# Patient Record
Sex: Female | Born: 1940 | Race: White | Hispanic: No | Smoking: Former smoker
Health system: Southern US, Community
[De-identification: ages and names within clinical notes are randomized; demographics above are authoritative.]

## PROBLEM LIST (undated history)

## (undated) DIAGNOSIS — R490 Dysphonia: Secondary | ICD-10-CM

## (undated) DIAGNOSIS — E041 Nontoxic single thyroid nodule: Secondary | ICD-10-CM

## (undated) DIAGNOSIS — R001 Bradycardia, unspecified: Secondary | ICD-10-CM

## (undated) DIAGNOSIS — E785 Hyperlipidemia, unspecified: Secondary | ICD-10-CM

## (undated) DIAGNOSIS — G2581 Restless legs syndrome: Secondary | ICD-10-CM

## (undated) DIAGNOSIS — I10 Essential (primary) hypertension: Secondary | ICD-10-CM

## (undated) DIAGNOSIS — I2510 Atherosclerotic heart disease of native coronary artery without angina pectoris: Secondary | ICD-10-CM

## (undated) DIAGNOSIS — J449 Chronic obstructive pulmonary disease, unspecified: Secondary | ICD-10-CM

## (undated) DIAGNOSIS — R51 Headache: Secondary | ICD-10-CM

## (undated) HISTORY — PX: PORTACATH PLACEMENT: SHX2246

## (undated) HISTORY — PX: HAND SURGERY: SHX662

## (undated) HISTORY — PX: RIGHT OOPHORECTOMY: SHX2359

## (undated) HISTORY — DX: Dysphonia: R49.0

## (undated) HISTORY — DX: Bradycardia, unspecified: R00.1

## (undated) HISTORY — DX: Chronic obstructive pulmonary disease, unspecified: J44.9

## (undated) HISTORY — DX: Headache: R51

## (undated) HISTORY — DX: Restless legs syndrome: G25.81

## (undated) HISTORY — DX: Atherosclerotic heart disease of native coronary artery without angina pectoris: I25.10

## (undated) HISTORY — PX: HIP SURGERY: SHX245

## (undated) HISTORY — PX: CARPAL TUNNEL RELEASE: SHX101

## (undated) HISTORY — DX: Nontoxic single thyroid nodule: E04.1

## (undated) HISTORY — DX: Hyperlipidemia, unspecified: E78.5

## (undated) HISTORY — PX: ROTATOR CUFF REPAIR: SHX139

## (undated) HISTORY — DX: Essential (primary) hypertension: I10

## (undated) HISTORY — PX: ABDOMINAL HYSTERECTOMY: SHX81

---

## 2001-11-08 ENCOUNTER — Encounter: Admission: RE | Admit: 2001-11-08 | Discharge: 2001-11-08 | Payer: Self-pay | Admitting: *Deleted

## 2001-11-08 ENCOUNTER — Encounter: Payer: Self-pay | Admitting: *Deleted

## 2001-11-11 ENCOUNTER — Encounter (INDEPENDENT_AMBULATORY_CARE_PROVIDER_SITE_OTHER): Payer: Self-pay | Admitting: *Deleted

## 2001-11-11 ENCOUNTER — Ambulatory Visit (HOSPITAL_BASED_OUTPATIENT_CLINIC_OR_DEPARTMENT_OTHER): Admission: RE | Admit: 2001-11-11 | Discharge: 2001-11-11 | Payer: Self-pay | Admitting: *Deleted

## 2004-06-10 ENCOUNTER — Inpatient Hospital Stay (HOSPITAL_COMMUNITY): Admission: EM | Admit: 2004-06-10 | Discharge: 2004-06-11 | Payer: Self-pay | Admitting: Emergency Medicine

## 2006-08-13 ENCOUNTER — Encounter: Admission: RE | Admit: 2006-08-13 | Discharge: 2006-08-13 | Payer: Self-pay | Admitting: Orthopedic Surgery

## 2006-08-15 ENCOUNTER — Ambulatory Visit (HOSPITAL_BASED_OUTPATIENT_CLINIC_OR_DEPARTMENT_OTHER): Admission: RE | Admit: 2006-08-15 | Discharge: 2006-08-15 | Payer: Self-pay | Admitting: Orthopedic Surgery

## 2006-12-05 ENCOUNTER — Ambulatory Visit (HOSPITAL_BASED_OUTPATIENT_CLINIC_OR_DEPARTMENT_OTHER): Admission: RE | Admit: 2006-12-05 | Discharge: 2006-12-05 | Payer: Self-pay | Admitting: Orthopedic Surgery

## 2006-12-13 ENCOUNTER — Encounter: Admission: RE | Admit: 2006-12-13 | Discharge: 2006-12-14 | Payer: Self-pay | Admitting: Orthopedic Surgery

## 2006-12-13 ENCOUNTER — Encounter: Admission: RE | Admit: 2006-12-13 | Discharge: 2007-03-11 | Payer: Self-pay | Admitting: Orthopedic Surgery

## 2007-12-16 ENCOUNTER — Encounter: Admission: RE | Admit: 2007-12-16 | Discharge: 2007-12-16 | Payer: Self-pay | Admitting: Orthopedic Surgery

## 2007-12-18 ENCOUNTER — Ambulatory Visit (HOSPITAL_BASED_OUTPATIENT_CLINIC_OR_DEPARTMENT_OTHER): Admission: RE | Admit: 2007-12-18 | Discharge: 2007-12-18 | Payer: Self-pay | Admitting: Orthopedic Surgery

## 2009-11-02 ENCOUNTER — Encounter: Payer: Self-pay | Admitting: Family

## 2009-12-27 ENCOUNTER — Encounter: Payer: Self-pay | Admitting: Family

## 2010-01-11 ENCOUNTER — Emergency Department (HOSPITAL_BASED_OUTPATIENT_CLINIC_OR_DEPARTMENT_OTHER): Admission: EM | Admit: 2010-01-11 | Discharge: 2010-01-11 | Payer: Self-pay | Admitting: Emergency Medicine

## 2010-01-17 ENCOUNTER — Emergency Department (HOSPITAL_BASED_OUTPATIENT_CLINIC_OR_DEPARTMENT_OTHER): Admission: EM | Admit: 2010-01-17 | Discharge: 2010-01-17 | Payer: Self-pay | Admitting: Emergency Medicine

## 2010-01-17 ENCOUNTER — Ambulatory Visit: Payer: Self-pay | Admitting: Radiology

## 2010-02-09 ENCOUNTER — Telehealth: Payer: Self-pay | Admitting: Family

## 2010-02-09 ENCOUNTER — Ambulatory Visit: Payer: Self-pay | Admitting: Family

## 2010-02-09 DIAGNOSIS — J4489 Other specified chronic obstructive pulmonary disease: Secondary | ICD-10-CM | POA: Insufficient documentation

## 2010-02-09 DIAGNOSIS — G894 Chronic pain syndrome: Secondary | ICD-10-CM

## 2010-02-09 DIAGNOSIS — F411 Generalized anxiety disorder: Secondary | ICD-10-CM | POA: Insufficient documentation

## 2010-02-09 DIAGNOSIS — E119 Type 2 diabetes mellitus without complications: Secondary | ICD-10-CM

## 2010-02-09 DIAGNOSIS — E785 Hyperlipidemia, unspecified: Secondary | ICD-10-CM

## 2010-02-09 DIAGNOSIS — F329 Major depressive disorder, single episode, unspecified: Secondary | ICD-10-CM

## 2010-02-09 DIAGNOSIS — J449 Chronic obstructive pulmonary disease, unspecified: Secondary | ICD-10-CM

## 2010-02-09 DIAGNOSIS — I2510 Atherosclerotic heart disease of native coronary artery without angina pectoris: Secondary | ICD-10-CM | POA: Insufficient documentation

## 2010-02-10 ENCOUNTER — Telehealth: Payer: Self-pay | Admitting: Family

## 2010-02-11 ENCOUNTER — Encounter: Payer: Self-pay | Admitting: Family

## 2010-02-11 DIAGNOSIS — Z950 Presence of cardiac pacemaker: Secondary | ICD-10-CM

## 2010-02-14 ENCOUNTER — Encounter: Payer: Self-pay | Admitting: Family

## 2010-02-17 ENCOUNTER — Telehealth: Payer: Self-pay | Admitting: Family

## 2010-02-22 ENCOUNTER — Encounter: Payer: Self-pay | Admitting: Family

## 2010-02-22 DIAGNOSIS — M949 Disorder of cartilage, unspecified: Secondary | ICD-10-CM

## 2010-02-22 DIAGNOSIS — M899 Disorder of bone, unspecified: Secondary | ICD-10-CM | POA: Insufficient documentation

## 2010-02-26 ENCOUNTER — Emergency Department (HOSPITAL_BASED_OUTPATIENT_CLINIC_OR_DEPARTMENT_OTHER): Admission: EM | Admit: 2010-02-26 | Discharge: 2010-02-26 | Payer: Self-pay | Admitting: Emergency Medicine

## 2010-02-28 ENCOUNTER — Telehealth: Payer: Self-pay | Admitting: Family

## 2010-02-28 DIAGNOSIS — M1990 Unspecified osteoarthritis, unspecified site: Secondary | ICD-10-CM | POA: Insufficient documentation

## 2010-03-01 ENCOUNTER — Telehealth: Payer: Self-pay | Admitting: Family

## 2010-03-02 ENCOUNTER — Telehealth: Payer: Self-pay | Admitting: Family

## 2010-03-07 ENCOUNTER — Encounter: Payer: Self-pay | Admitting: Family

## 2010-03-09 ENCOUNTER — Encounter: Payer: Self-pay | Admitting: Family

## 2010-03-15 ENCOUNTER — Ambulatory Visit: Payer: Self-pay | Admitting: Family

## 2010-03-15 ENCOUNTER — Telehealth: Payer: Self-pay | Admitting: Family

## 2010-03-16 ENCOUNTER — Encounter: Payer: Self-pay | Admitting: Family

## 2010-03-16 ENCOUNTER — Telehealth: Payer: Self-pay | Admitting: Family

## 2010-03-18 ENCOUNTER — Encounter: Payer: Self-pay | Admitting: Family

## 2010-05-05 NOTE — Assessment & Plan Note (Signed)
Summary: NEW TO [facility] /[facility] rsch per pt/dt-- Rm 6   Vital Signs:  Patient profile:   70 year old female Height:      64 inches Weight:      124.25 pounds BMI:     21.40 O2 Sat:      100 % Temp:     97.9 degrees F oral Pulse rate:   83 / minute Pulse rhythm:   regular Resp:     16 per minute BP sitting:   120 / 70  (left arm) Cuff size:   regular  Vitals Entered By: Mervin Kung CMA Duncan Dull) (February 09, 2010 10:17 AM) CC: New pt to establish care.   Is Patient Diabetic? Yes Pain Assessment Patient in pain? yes     Location: left hand Intensity: 7 Type: dull, radiates up arm Onset of pain  3-4 weeks ago Comments Pt has pain management and psychiatrist. Mervin Kung CMA [facility] Dull)  February 09, 2010 10:55 AM    Visit Type:  Establish care  CC:  New pt to establish care.  Marland Kitchen  History of Present Illness: Ms Criswell is a 70 year old female wo presents today to establish care.  She was seen in the ED for complaint of left wrist pain.  Notes history of chronic pain, now with tingling in her feet.   Patient has a pain management contract with her previous provider- Dr. Solon Augusta in [place].  1) DM- Takes metformin twice daily - reports that her sugars run normal.    2)Depression- "doesn't feel like doing anything."  Cymbalta, clonazepam, Trazadone, vistaril- prescribed by her psychiatrist.  Sees Dr. Donell Beers. Would like referral to a different psychiatrist.    3)Cardiology- sees Dr. Beverely Pace  4)COPD [facility] for COPD  5)[facility]- had specialist.    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  Has been on percocet for 1 year.  Patient take percocet every 4-6 hours.  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    7) COPD on 02 2L nasal cannula.    8) Dermatology- gary epsom- he is following her for a rash on her hands.  9) Previously followed by Solon Augusta- [facility], [place]  Preventive Screening-Counseling &  Management  Alcohol-Tobacco     Alcohol drinks/day: 0     Smoking Status: current     Packs/Day: 0.75     Pack years: 16 yrs     Cans of tobacco/week: snus (snuff in a pouch) 1 pouch daily x 2 weeks  Caffeine-Diet-Exercise     Caffeine use/day: coffee and tea daily     Does Patient Exercise: no  Allergies (verified): 1)  ! Codeine  Past History:  Past Medical History: Arthritis Hx of chicken pox Depression Diabetes Emphysema chronic bronchitis pace maker cardiac stents x 2 Hypercholesterolemia  Past Surgical History: Pacemaker--2008 Cardiac Stents-- 2008 Hysterectomy-- 1969 Right hip surgery-- 1996 Right hand fusion-- 2005 Left rotator cuff repair-- 2006  Family History: Mother-- deceased; heart disease, diabetes, HTN Father--  deceased; abdominal aneurysm 1 brother-- A & W 2 sisters--  both had breast cancer, 1 has diabetes, heart disease 1 son Hepatitis C 2 sons A & W 1 son-- deceased, stillbirth  Social History: Occupation: disabled Separated Current Smoker Alcohol use-no Regular exercise-no Smoking Status:  current Packs/Day:  0.75 Caffeine use/day:  coffee and tea daily Does Patient Exercise:  no  Review of Systems  Constitutional: Denies Fever ENT:  Denies nasal congestion or sore throat. Resp: chronic cough CV:  Denies Chest Pain, chronic SOB/DOE GI:  Denies nausea or vomitting, or diarrhea GU: Denies dysuria Lymphatic: Denies lymphadenopathy Musculoskeletal:  see HPI Skin:  Denies Rashes,  notes rash on hands- follows with Dermatology Psychiatric: Depression is controlled at times Neuro: Denies numbness or weakness.       Physical Exam  General:  Well-developed,well-nourished,in no acute distress; alert,appropriate and cooperative throughout examination Head:  Normocephalic and atraumatic without obvious abnormalities. No apparent alopecia or balding. Eyes:  PERRLA, sclera are clear without injection Ears:  External ear exam  shows no significant lesions or deformities.  Otoscopic examination reveals clear canals, tympanic membranes are intact bilaterally without bulging, retraction, inflammation or discharge. Hearing is grossly normal bilaterally. Mouth:  Oral mucosa and oropharynx without lesions or exudates.  Teeth in good repair. Neck:  No deformities, masses, or tenderness noted. Lungs:  Normal respiratory effort, chest expands symmetrically. Lungs with soft expiratory wheeze, coarse rhonchi bilaterally Heart:  Normal rate and regular rhythm. S1 and S2 normal without gallop, murmur, click, rub or other extra sounds. Abdomen:  Bowel sounds positive,abdomen soft and non-tender without masses, organomegaly or hernias noted. Msk:  Unable to flex/extend right wrist. Extremities:  No clubbing, cyanosis, edema, or deformity noted  Skin:  dry appearing red rash noted on bilateral hands Cervical Nodes:  No lymphadenopathy noted Psych:  Cognition and judgment appear intact. Alert and cooperative with normal attention span and concentration. No apparent delusions, illusions, hallucinations   Impression & Recommendations:  Problem # 1:  CHRONIC PAIN SYNDROME (ICD-338.4) Assessment Comment Only I have explained to patient that we need to obtain her records from her old provider before I can prescribe the percocet.  I have also told her that long term, she will need to be followed by a pain management specialist and I will make the referral for her when I have more information on her medical history.  A pain contract was signed today.    Problem # 2:  CAD (ICD-414.00) Assessment: Comment Only Patient is followed by cardiology, on toprol Xl,  will add baby aspirin.  Her updated medication list for this problem includes:    Toprol Xl 25 Mg Xr24h-tab (Metoprolol succinate) .Marland Kitchen... Take 1 tablet by mouth once a day.    Aspirin Ec 81 Mg Tbec (Aspirin) ..... One tablet by mouth daily  Problem # 3:  COPD (ICD-496) Assessment:  Unchanged + wheezing, pt reports that breathing is about at baseline, continue home meds.  Monitor. Her updated medication list for this problem includes:    Symbicort 160-4.5 Mcg/act Aero (Budesonide-formoterol fumarate) .Marland Kitchen... Take 2 puffs twice a day.    Proair Hfa 108 (90 Base) Mcg/act Aers (Albuterol sulfate) .Marland Kitchen... Take 2 puffs every 4 hours    Spiriva Handihaler 18 Mcg Caps (Tiotropium bromide monohydrate) ..... Inhale 1 capsule daily.  Problem # 4:  DIABETES MELLITUS, TYPE II (ICD-250.00) Assessment: New Patient reports well controlled will request old records. Her updated medication list for this problem includes:    Metformin Hcl 1000 Mg Tabs (Metformin hcl) .Marland Kitchen... Take 1 tablet by mouth two times a day.    Aspirin Ec 81 Mg Tbec (Aspirin) ..... One tablet by mouth daily  Problem # 5:  DEPRESSION (ICD-311) Assessment: Unchanged  She is followed by psychiatry.  Will refer to another provider. Her updated medication list for this problem includes:    Clonazepam 1 Mg Tbdp (Clonazepam) .Marland KitchenMarland KitchenMarland KitchenMarland Kitchen  Take 1 tablet every morning and 2 every evening.    Trazodone Hcl 100 Mg Tabs (Trazodone hcl) .Marland Kitchen... Take 3 tablets by mouth at bedtime.    Vistaril 25 Mg Caps (Hydroxyzine pamoate) .Marland Kitchen... Take 1 capsule every morning and 2 capsules every evening.  Orders: Psychiatric Referral (Psych)  Complete Medication List: 1)  Symbicort 160-4.5 Mcg/act Aero (Budesonide-formoterol fumarate) .... Take 2 puffs twice a day. 2)  Proair Hfa 108 (90 Base) Mcg/act Aers (Albuterol sulfate) .... Take 2 puffs every 4 hours 3)  Spiriva Handihaler 18 Mcg Caps (Tiotropium bromide monohydrate) .... Inhale 1 capsule daily. 4)  Percocet 7.5-500 Mg Tabs (Oxycodone-acetaminophen) .... Take 1 tablet every 4 -6 hours as needed. 5)  Simvastatin 10 Mg Tabs (Simvastatin) .... Take 1 tab by mouth at bedtime. 6)  Loratadine 10 Mg Tabs (Loratadine) .... Take 1 tablet by mouth once a day as needed. 7)  Meclizine Hcl 25 Mg Tabs  (Meclizine hcl) .... Take 1 tablet every 8 hours as needed. 8)  Metformin Hcl 1000 Mg Tabs (Metformin hcl) .... Take 1 tablet by mouth two times a day. 9)  Carisoprodol 350 Mg Tabs (Carisoprodol) .... Take 1 tablet every 8 hours as needed. 10)  Toprol Xl 25 Mg Xr24h-tab (Metoprolol succinate) .... Take 1 tablet by mouth once a day. 11)  Clonazepam 1 Mg Tbdp (Clonazepam) .... Take 1 tablet every morning and 2 every evening. 12)  Trazodone Hcl 100 Mg Tabs (Trazodone hcl) .... Take 3 tablets by mouth at bedtime. 13)  Vistaril 25 Mg Caps (Hydroxyzine pamoate) .... Take 1 capsule every morning and 2 capsules every evening. 14)  Premarin 0.3 Mg Tabs (Estrogens conjugated) .... Take 1 tablet by mouth once a day. 15)  Oxygen  .... 2 l/min. 16)  Aspirin Ec 81 Mg Tbec (Aspirin) .... One tablet by mouth daily  Patient Instructions: 1)  You will be contacted about your referral to psychiatry and pain management. 2)  Please schedule a follow-up appointment in 1 month. 3)  Welcome to [facility], it was a pleasure to meet you.   Orders Added: 1)  Psychiatric Referral [Psych] 2)  New Patient Level III [09811]    Current Allergies (reviewed today): ! CODEINE   Preventive Care Screening  Last Pneumovax:    Date:  12/02/2009    Results:  historical   Last Flu Shot:    Date:  12/02/2009    Results:  historical   Colonoscopy:    Date:  04/03/2009    Results:  normal   Mammogram:    Date:  04/03/2008    Results:  normal   Pap Smear:    Date:  04/03/2008    Results:  normal      Pt doesn't remember last Tetanus shot.  Thinks she had bone density years ago--osteopenia.  Nicki Guadalajara Fergerson CMA Duncan Dull)  February 09, 2010 10:55 AM

## 2010-05-05 NOTE — Miscellaneous (Signed)
Summary: Controlled Substance Agreement  Controlled Substance Agreement   Imported By: Lanelle Bal 02/18/2010 10:46:08  _____________________________________________________________________  External Attachment:    Type:   Image     Comment:   External Document

## 2010-05-05 NOTE — Miscellaneous (Signed)
  Clinical Lists Changes  Problems: Changed problem from CAD (ICD-414.00) - s/p stents x 2 to CAD (ICD-414.00) - s/p stent  (Drug eluting stent to LAD)2010 Added new problem of PACEMAKER, PERMANENT (ICD-V45.01)

## 2010-05-05 NOTE — Letter (Signed)
Summary: Records from [facility] 2010 - 2011  Records from [facility] 2010 - 2011   Imported By: Maryln Gottron 03/07/2010 13:08:11  _____________________________________________________________________  External Attachment:    Type:   Image     Comment:   External Document

## 2010-05-05 NOTE — Progress Notes (Signed)
Summary: add ASA  Phone Note Outgoing Call   Summary of Call: Please call patient and let her know that I have reviewed her medications and she should add a baby aspirin 81 mg by mouth daily if she is not already taking. Initial call taken by: Lemont Fillers FNP,  February 09, 2010 4:39 PM  Follow-up for Phone Call        Pt notified. Nicki Guadalajara Fergerson CMA [facility] Dull)  February 10, 2010 9:41 AM     New/Updated Medications: ASPIRIN EC 81 MG TBEC (ASPIRIN) one tablet by mouth daily

## 2010-05-05 NOTE — Letter (Signed)
Summary: Controlled Substances Contract  [facility]  [address]   [place], [place] [address]   Phone: [phone]  Fax: [phone]    [facility] Controlled Substances Contract         Patient Name: Shelby Hunt Patient DOB: December 03, 1940        Patient MRN:  [id number]        Physician's Name: _________________________________________   Patients must complete this contract before doctors at the [facility] office will be willing to prescribe controlled substances. I understand that: ___1)  I am responsible for my controlled substance medications.  If my prescription is lost, misplaced or stolen, or if I take more than prescribed, my doctor will not write me a new prescription. ___2)  I will not request or accept controlled substances or controlled substance prescriptions from any other doctor or clinic while I am receiving controlled substance treatment at [facility].  The ONLY exception is if controlled substances are prescribed for the treatment of an acute condition that is NOT the diagnosis for which I am receiving treatment at [facility].   I will call my physician at [facility] if I receive controlled substance or controlled substance prescriptions from anywhere else. ___3)  Controlled substance refills will be made ONLY during regular office hours. ___4)  Refills will not be made if I run out early.  Refills will not be made during work-in or urgent care visits.  Refills will NOT be made for "emergencies", such as on a Friday afternoon or by on call service at night or weekends.  I understand that I am required to call at least 2 business days prior to expiration date for controlled substance and/or needing controlled substance refills.   ___5)  I will not use illicit (illegal) drugs.  ___6)  I agree to take urine or blood drug tests when requested for routine screening. ___7)  I agree to use only ONE pharmacy  for filling ALL my controlled substance prescriptions.             Name and Location of Pharmacy:                                                                                                                  ___8)  I understand that my doctor may review my use of controlled substances using the [facility] Controlled Substance Reporting System. ___9)  If I behave in an abusive way towards [facility] staff, my controlled substance prescriptions may be stopped, and I may be dismissed from this practice. ___10)  I understand that if I break any of the above terms of this contract, my pain prescription and/or treatment may be stopped immediately.  If I get controlled substances from someone else or use illegal drugs, I may be reported to all my doctors, medical facilities and appropriate authorities.  I have been fully informed by [facility] physicians and the staff regarding psychological dependence (  addiction) to controlled substances.  I understand that I should stop my medication ONLY under medical supervision or I may have withdrawal symptoms.  ***I have read this contract and it has been explained to me by [facility] physicians and/or their staff, and I fully understand the consequences of violating any of the terms of this contract.    Patient Signature _________________________________________ Date February 09, 2010   [facility] Staff Signature ____________________________________ Date February 09, 2010

## 2010-05-05 NOTE — Letter (Signed)
Summary: Records Dated 11-12-08 thru 07-14-09/[facility]   Records Dated 11-12-08 thru 07-14-09/[facility]   Imported By: Lanelle Bal 04/01/2010 12:55:29  _____________________________________________________________________  External Attachment:    Type:   Image     Comment:   External Document

## 2010-05-05 NOTE — Progress Notes (Signed)
Summary: records received  Phone Note Other Incoming   Summary of Call: Records received from Dr Deboraha Sprang and forwarded to Shaquel Josephson for review. Nicki Guadalajara Fergerson CMA Duncan Dull)  March 16, 2010 12:04 PM

## 2010-05-05 NOTE — Letter (Signed)
Summary: Patient Going Back to [facility]  Patient Going Back to [facility]   Imported By: Lanelle Bal 03/31/2010 14:08:04  _____________________________________________________________________  External Attachment:    Type:   Image     Comment:   External Document

## 2010-05-05 NOTE — Progress Notes (Signed)
Summary: record release, referral  Phone Note Outgoing Call   Summary of Call: Please call patient and ask her if she can come by the office to sign medical release from Dr.  Dillard [facility] office.  I would like to evaluate his records in regards to arthritis and possible rheumatoid arthritis. Also, please ask her if she is scheduled to see pain management.  Initial call taken by: Lemont Fillers FNP,  March 15, 2010 4:20 PM  Follow-up for Phone Call        Left message on machine to return my call. Nicki Guadalajara Fergerson CMA [facility] Dull)  March 15, 2010 4:44 PM   Additional Follow-up for Phone Call Additional follow up Details #1::        Spoke to pt re: records release. She states that she hasn't seen Dr Cardell Peach in years because he doesn't accept her insurance. She also states that she found out that Efraim Kaufmann is not a participating Provider with [facility] and pt will not be seeing [place] any longer. She states she is going back to Dr Willa Rough in [facility]. Nicki Guadalajara Fergerson CMA (AAMA)  March 16, 2010 1:16 PM

## 2010-05-05 NOTE — Progress Notes (Signed)
Summary: pharmacy needs hard copy of rx   Phone Note From Pharmacy   Caller: [facility] [address] [place] Call For: [place]   Summary of Call: the percocet cannot be faxed in or sent electronically.  Patient must bring in a hard copy of rx  Initial call taken by: Roselle Locus,  March 01, 2010 11:05 AM  Follow-up for Phone Call        Spoke with pt and she states she will have someone pick Rx up for her because she is unable. Pt states she feels like she is getting pneumonia (hurting on both sides of her back). Advised pt she needs to be seen. Pt states she will wait and see how she feels tomorrow and call for appt if she is not better. Nicki Guadalajara Fergerson CMA Duncan Dull)  March 01, 2010 11:46 AM

## 2010-05-05 NOTE — Letter (Signed)
Summary: [facility]  [facility]   Imported By: Lanelle Bal 03/02/2010 11:06:43  _____________________________________________________________________  External Attachment:    Type:   Image     Comment:   External Document

## 2010-05-05 NOTE — Miscellaneous (Signed)
  Clinical Lists Changes  Problems: Added new problem of OSTEOPENIA (ICD-733.90) - Signed Changed problem from PACEMAKER, PERMANENT (ICD-V45.01) to PACEMAKER, PERMANENT (ICD-V45.01) - history of sick sinus syndrome - Signed

## 2010-05-05 NOTE — Letter (Signed)
Summary: Records from [facility] 2009 - 2011  Records from [facility] 2009 - 2011   Imported By: Maryln Gottron 03/07/2010 13:15:28  _____________________________________________________________________  External Attachment:    Type:   Image     Comment:   External Document

## 2010-05-05 NOTE — Progress Notes (Signed)
Summary: refill--Percocet  Phone Note Refill Request Call back at [phone]   Refills Requested: Medication #1:  PERCOCET 7.5-500 MG TABS Take 1 tablet every 4 -6 hours as needed.   Supply Requested: 1 month Received call from pt stating that she saw dermatologist this am for blisters on her hand. Was told it isn't MRSA. Was prescribed Methotrexate but pharmacy will not have it until tomorrow. Saw ER and was given rx for Bactrim for her hands. States she is in a lot of pain and would like the Percocet strength increased to 10mg . Please advise. Nicki Guadalajara Fergerson CMA Duncan Dull)  February 28, 2010 2:58 PM   Next Appointment Scheduled: 03-09-10 Peggyann Juba NP Caller: Patient Call For: Lemont Fillers FNP  Follow-up for Phone Call        Cannot increase percocet.  If her hand is not improving, she will need to be evaluated in the office.  Follow-up by: Lemont Fillers FNP,  February 28, 2010 3:55 PM  Additional Follow-up for Phone Call Additional follow up Details #1::        Can we still refill her current dose? Please advise. Nicki Guadalajara Fergerson CMA Duncan Dull)  February 28, 2010 4:09 PM     Additional Follow-up for Phone Call Additional follow up Details #2::    Advised pt and she voices understanding. Rx has been faxed to [facility] on [address]. Nicki Guadalajara Fergerson CMA Duncan Dull)  February 28, 2010 4:49 PM   New/Updated Medications: PERCOCET 7.5-500 MG TABS (OXYCODONE-ACETAMINOPHEN) Take 1 tablet every  6 hours as needed. Prescriptions: PERCOCET 7.5-500 MG TABS (OXYCODONE-ACETAMINOPHEN) Take 1 tablet every  6 hours as needed.  #120 x 0   Entered and Authorized by:   Lemont Fillers FNP   Signed by:   Lemont Fillers FNP on 02/28/2010   Method used:   Print then Give to Patient   RxID:   [id number]

## 2010-05-05 NOTE — Miscellaneous (Signed)
  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

## 2010-05-05 NOTE — Letter (Signed)
Summary: Records Dated 11-12-09 thru 02-28-10/[facility]  Records Dated 11-12-09 thru 02-28-10/[facility]   Imported By: Lanelle Bal 03/19/2010 11:19:59  _____________________________________________________________________  External Attachment:    Type:   Image     Comment:   External Document

## 2010-05-05 NOTE — Progress Notes (Signed)
Summary: records rec from [facility]   Phone Note Other Incoming   Caller: [facility] Summary of Call: records received from [facility]  Initial call taken by: Roselle Locus,  February 10, 2010 3:10 PM

## 2010-05-05 NOTE — Progress Notes (Signed)
  Phone Note Other Incoming   Summary of Call: Patient presented to front desk at 4:45PM without appointment.  Receptionist came to get me to tell me that she appeared acutely short of breath.  I presented to the waiting room to evaluate patient and she was sitting calmly in the waiting room in NAD.  When I got out there she said that she is having severe pain in her hands.  Was evaluated by Derm for rash today.  Nelida Meuse PA-c saw her.  She is requesting something stronger for pain. Has rx for percocet 7.5 in her hand which she did not fill.  She tells me that her breathing is at her baseline.  I told patient that I will be unable to give her anything stronger for pain.  If her pain or shortness of breath worsens, she should be seen in the ED.  Otherwise, I asked her to make an appointment for a formal outpatient visit.  Will try to obtain records from her dermatologist.  Initial call taken by: Lemont Fillers FNP,  March 02, 2010 4:56 PM

## 2010-05-05 NOTE — Progress Notes (Signed)
  Phone Note Outgoing Call    New Problems: DEGENERATIVE JOINT DISEASE, ADVANCED (ICD-715.90)   New Problems: DEGENERATIVE JOINT DISEASE, ADVANCED (ICD-715.90)

## 2010-05-05 NOTE — Progress Notes (Signed)
Summary: Proair alternative?  Phone Note Call from Patient Call back at [phone]   Caller: Patient Call For: Lemont Fillers FNP Summary of Call: Received call from pt stating that she is out of Proair and insurance will not cover it until December. Pt states she doesn't think it is helping anymore and she feels she needs something stronger or can we increase the does so insurance will cover it. Please advise. Mervin Kung CMA Duncan Dull)  February 17, 2010 2:49 PM   Follow-up for Phone Call        Left message for patient to return our call.  If she is agreeable we can call in rx for albuterol nebs.  Does she have a nebulizer?  However, if breathing symptoms are deteriorated, she should schedule an offic visit.  Follow-up by: Lemont Fillers FNP,  February 18, 2010 8:36 AM  Additional Follow-up for Phone Call Additional follow up Details #1::        She cannot get any more of the albuterol nebs till December.  She gets three tubes of that a month.  If Efraim Kaufmann writes a high doage of the albuteral she will be able to get that through her insurance.  She gets now.  Please advise patient [phone] Roselle Locus  February 18, 2010 8:47 AM    Additional Follow-up for Phone Call Additional follow up Details #2::    Spoke to pt.  Will leave samples at the front desk for ventolin HFA.  Pt reports that she is at her baseline SOB this am.  Instructed patient to schedule apt to be see if her SOB worsens.  Pt verbalizes understanding. Follow-up by: Lemont Fillers FNP,  February 18, 2010 8:59 AM  New/Updated Medications: VENTOLIN HFA 108 (90 BASE) MCG/ACT AERS (ALBUTEROL SULFATE) 2 puffs every 4-6 hours as needed Prescriptions: VENTOLIN HFA 108 (90 BASE) MCG/ACT AERS (ALBUTEROL SULFATE) 2 puffs every 4-6 hours as needed  #2 x 0   Entered and Authorized by:   Lemont Fillers FNP   Signed by:   Lemont Fillers FNP on 02/18/2010   Method used:   Samples Given  RxID:   [id number]

## 2010-06-15 LAB — CBC
HCT: 36.1 % (ref 36.0–46.0)
MCHC: 33.6 g/dL (ref 30.0–36.0)
Platelets: 192 10*3/uL (ref 150–400)
RDW: 13.4 % (ref 11.5–15.5)

## 2010-06-15 LAB — DIFFERENTIAL
Basophils Absolute: 0.1 10*3/uL (ref 0.0–0.1)
Basophils Relative: 1 % (ref 0–1)
Monocytes Absolute: 0.4 10*3/uL (ref 0.1–1.0)
Neutro Abs: 5.4 10*3/uL (ref 1.7–7.7)
Neutrophils Relative %: 69 % (ref 43–77)

## 2010-06-15 LAB — BASIC METABOLIC PANEL
BUN: 10 mg/dL (ref 6–23)
Calcium: 8.8 mg/dL (ref 8.4–10.5)
Creatinine, Ser: 0.7 mg/dL (ref 0.4–1.2)
GFR calc non Af Amer: >60 mL/min (ref >60)
Glucose, Bld: 112 mg/dL — ABNORMAL HIGH (ref 70–99)

## 2010-06-16 LAB — BASIC METABOLIC PANEL
BUN: 10 mg/dL (ref 6–23)
CO2: 33 mEq/L — ABNORMAL HIGH (ref 19–32)
Chloride: 102 mEq/L (ref 96–112)
Creatinine, Ser: 0.7 mg/dL (ref 0.4–1.2)
GFR calc Af Amer: >60 mL/min (ref >60)
Potassium: 3.9 mEq/L (ref 3.5–5.1)

## 2010-06-16 LAB — DIFFERENTIAL
Eosinophils Absolute: 0.3 10*3/uL (ref 0.0–0.7)
Eosinophils Relative: 2 % (ref 0–5)
Lymphocytes Relative: 29 % (ref 12–46)
Lymphs Abs: 3.4 10*3/uL (ref 0.7–4.0)
Monocytes Absolute: 0.9 10*3/uL (ref 0.1–1.0)

## 2010-06-16 LAB — CBC
HCT: 41 % (ref 36.0–46.0)
MCH: 28.8 pg (ref 26.0–34.0)
MCV: 87 fL (ref 78.0–100.0)
Platelets: 185 10*3/uL (ref 150–400)
RBC: 4.71 MIL/uL (ref 3.87–5.11)
WBC: 12 10*3/uL — ABNORMAL HIGH (ref 4.0–10.5)

## 2010-08-16 NOTE — Op Note (Signed)
NAME:  Shelby Hunt, FERRUFINO               ACCOUNT NO.:  [id number]   MEDICAL RECORD NO.:  [id number]           PATIENT TYPE:   LOCATION:                                 FACILITY:   PHYSICIAN:  Artist Pais. Weingold, M.D.DATE OF BIRTH:  08/01/1940   DATE OF PROCEDURE:  12/05/2006  DATE OF DISCHARGE:                               OPERATIVE REPORT   PREOPERATIVE DIAGNOSIS:  Advanced degenerative joint disease, right  wrist.   POSTOPERATIVE DIAGNOSIS:  Advanced degenerative joint disease, right  wrist.   PROCEDURE:  Right wrist arthrodesis using synthes short band, stainless  steel, wrist fusion plate and screws and VITOSS bone graft with iliac  crest aspirate.   SURGEON:  Artist Pais. Mina Marble, M.D.   ASSISTANT:  None.   ANESTHESIA:  General.   TOURNIQUET TIME:  1 hour and 18 minutes.   COMPLICATIONS:  None.   DRAINS:  None.   OPERATIVE REPORT:  The patient was taken to the operating suite after  the induction of adequate general anesthesia; and a preoperative  axillary block for postop pain.  The patient's right upper extremity and  right iliac crest were prepped and draped in the usual sterile fashion.  An Esmarch was used to exsanguinate the limb.  Tourniquet was then  inflated to 250 mmHg.  At this point in time, a midline incision was  made over the radiocarpal joint of the right wrist; and skin was  incised.   Flaps raised accordingly, with care to carefully dissect, retract, and  identified branches of the superficial radial nerve and ulnar sensory  nerve branches.  Once this was done, the interval between the second and  fourth dorsal compartments was identified and split.  The EPL was  transposed in the 3rd dorsal compartment.  A subperiosteal dissection  was then undertaken to elevate the second and fourth dorsal  compartments.  These tendons were then retracted respectively, and a  midline dorsal capsulotomy was performed.   Dissection was carried down to the base of  the third metacarpal across  the capitate and down to the shaft dorsally of the radius.  After the  periosteally dissection was carried out there appeared to be auto fusion  of the midcarpal joints with significant degenerative changes at the  radiocarpal joints.  Using a combination of curettes and rongeurs.  The  articular surfaces of the scaphoid and lunate as well as part of the  metacarpal joint was decorticated down to cancellous bone as well as the  distal end of the radius.  After this was done a short bent plate, which  was deemed to be the appropriate plate for her size wrist, was then  fashioned with one screw of the metacarpal shaft to the long finger; and  onto the radius to ensure appropriate alignment.  This was determined  using intraoperative fluoroscopy.   After this was done, the iliac crest was approached with a small stab  wound and the VITOSS bone graft aspirate kit was then used to aspirate  10 mL of bone marrow using standard techniques.  This was then mixed  with the VITOSS graft and placed into the radiocarpal joint under  fluoroscopic and direct guidance.  The remaining cortical screws were  placed into the shaft of the radius proximally at 3.5 mm and 2.7 mm  distally.  Intraoperative fluoroscopy revealed a good placement of the  hardware and bone graft and with the AP, lateral, and oblique view.  The  wound was irrigated.  The extensor tendons were realigned using 2-0  undyed Vicryl; and the skin was closed with 4-0 Vicryl Rapide suture.  One single suture was then used to close the iliac crest aspirate site.  A Xeroform, 4x4 fluffs, and a volar splint was applied to the wrist; and  a small compressive bandage to the iliac crest.  The patient tolerated  the procedure as well, and went to the recovery room in stable fashion.      Artist Pais Mina Marble, M.D.  Electronically Signed     MAW/MEDQ  D:  12/05/2006  T:  12/05/2006  Job:  [id number]

## 2010-08-16 NOTE — Op Note (Signed)
NAME:  Shelby Hunt, Shelby Hunt               ACCOUNT NO.:  [id number]   MEDICAL RECORD NO.:  [id number]          PATIENT TYPE:  AMB   LOCATION:  DSC                          FACILITY:  [facility]   PHYSICIAN:  Artist Pais. Weingold, M.D.DATE OF BIRTH:  06-27-1940   DATE OF PROCEDURE:  12/18/2007  DATE OF DISCHARGE:                               OPERATIVE REPORT   PREOPERATIVE DIAGNOSIS:  Left shoulder rotator cuff tear with  acromioclavicular joint arthritis and superior labrum anterior and  posterior lesion.   POSTOPERATIVE DIAGNOSIS:  Left shoulder rotator cuff tear with  acromioclavicular joint arthritis and superior labrum anterior and  posterior lesion.   PROCEDURE:  Left shoulder arthroscopy with debridement of superior  labrum anterior and posterior lesion and open rotator cuff repair with  open biceps tenodesis as well as distal clavicle excision and  subacromial decompression arthroscopically   SURGEONS:  Molli Hazard A. Mina Marble, MD and Harvie Junior, MD   ANESTHESIA:  General.   TOURNIQUET:  None.   COMPLICATIONS:  None.   DRAINS:  None.   OPERATIVE REPORT:  The patient was taken to the operating suite.  After  induction of adequate general anesthesia, she was placed in the beach-  chair position.  All bony prominences were padded and the left upper  extremity was prepped and draped in the usual sterile fashion.  Once  this was done, a standard arthroscopic portal was established  posteromedially in the soft spot.  The skin was incised sharply below  the posteromedial aspect of the acromion.  After this was done, scope  was introduced into the joint.  Visualization revealed severe fraying of  the biceps insertion at the superior labrum consistent with SLAP lesion  as well as some mild posterior labral fraying as well.  An anterior  portal was established.  After this was done, the biceps was carefully  inspected.  It was almost complete tearing.  This tear was completed  using a  suction shaver 4.2 mm followed by debridement of the labrum both  anteriorly and posteriorly as well as superiorly.  After this was done,  the rest of the joint was inspected.  There was a clear supraspinatus  tear at the insertion point.  The scope was removed from the  glenohumeral joint, it was then placed subacromially where a subacromial  decompression was undertaken as well as distal clavicle excision.  After  the subacromial space was identified, a lateral portal was established.  The lateral portal was then used to introduce the suction shaver as well  as the ArthroCare wand.  The subacromial space was debrided so  visualization could be complete.  After this was done, the distal  clavicle under the surface of the acromion were carefully debrided.  After this was completed and inspected, there was adequate space with  subacromial decompression.  This was also again undertaken using a 6 mm  bur.  After the acromion and distal clavicle were carefully resected,  the instruments were removed from the field.  The lateral portal was  then extended proximally and distally for approximately 2 inches.  The  skin was incised.  The deltoid was split distal to the [facility] joint and  dissection was carried down to the cuff.  The biceps groove was  identified and opened slightly.  The biceps tendon was identified and  tagged with a suture.  At this point in time, just posterior to the  biceps groove, the supraspinatus tear was identified and debrided to  healthy tissue.  A trough was made where a 5.5 mm Arthrex Corkscrew  anchor was placed.  There was also a Corkscrew anchor placed into the  bicipital groove for the tenodesis.  The biceps tendon was then  tenodesed with the arm in full extension under appropriate tension using  the 5.5 mm Corkscrew.  The repair of the rotator cuff was then  undertaken using a 5.5 Corkscrew as well and then tied down distally  onto the neck of the humerus through the  periosteum and soft tissue.  Prior to this repair, all the bursa was resected.  At the end of the  repair, everything was inspected and there was no significant signs of  impingement.  The wound was irrigated and loosely closed with 0 Vicryl  to repair the deltoid muscle, and 3-0 Prolene subcuticular stitch to  close the portals and the open rotator cuff repair incision.  Steri-  Strips, 4x4s, fluffs, and a compressive sterile dressing was applied.  The patient tolerated the procedures well and went to recovery room in  stable fashion.      Artist Pais Mina Marble, M.D.  Electronically Signed     MAW/MEDQ  D:  12/18/2007  T:  12/18/2007  Job:  [id number]

## 2010-08-19 NOTE — H&P (Signed)
NAME:  Shelby Hunt, Shelby Hunt NO.:  [id number]   MEDICAL RECORD NO.:  [id number]          PATIENT TYPE:  INP   LOCATION:  1827                         FACILITY:  [facility]   PHYSICIAN:  Melina Fiddler, MD DATE OF BIRTH:  1941/03/27   DATE OF ADMISSION:  06/10/2004  DATE OF DISCHARGE:                                HISTORY & PHYSICAL   CHIEF COMPLAINT:  Chest pain.   HISTORY OF PRESENT ILLNESS:  This is a 70 year old white female with history  of COPD who presents with chest pain x1 day. The patient began having chest  pain last night while at rest. The pain radiated over both left and right  chest while most of the pain was focused substernally and described as  pressure. No shortness of breath or diaphoresis was associated with this  pain. The pain continued this morning and patient began to feel shaky and  light headed, therefore she came to the ER.  The patient also mentioned  that she felt bad all day yesterday, just did not have any energy and felt  mildly nauseated. The patient has had chest pain once before and states that  she was supposed to have a stress-test but never got that done.   REVIEW OF SYSTEMS:  The patient states she is cold all the time and  shortness of breath daily.   ALLERGIES:  CODEINE (causes nausea and vomiting).   CURRENT MEDICATIONS:  1.  Effexor 75 mg daily.  2.  Flexeril 10 mg b.i.d. p.r.n.  3.  Naproxen, unsure of dosage.  4.  Tramadol, unsure of dosage.  5.  Plendil 5 or 10 mg daily.  6.  Advair 250/50 one puff b.i.d.  7.  Albuterol q.i.d.   PAST MEDICAL HISTORY:  1.  Significant for occasional vertigo.  2.  Hypertension.  3.  Chronic obstructive pulmonary disease.  4.  Depression.  5.  Status post hysterectomy.  6.  Right hip open reduction and internal fixation.   FAMILY HISTORY:  Significant for diabetes, coronary artery disease, and  breast cancer in a sister.   SOCIAL HISTORY:  The patient smokes one pack per day for  the last 45 years.  Has history of alcohol abuse.  No drug use. 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 The patient denies having current alcohol abuse,  states that she does drink occasionally but has not abused alcohol in some  time.   PHYSICAL EXAMINATION:  VITAL SIGNS:  Temperature is 98.4, blood pressure  124/70, pulse of 95, respiratory rate of 20, saturating 95% on room air.  GENERAL:  She is alert, in no apparent distress, well groomed.  HEENT:  Pupils are equal, round and reactive to light. Oropharynx clear.  Dentures in place.  NECK:  Prominent right lobe of the thyroid.  CARDIOVASCULAR:  Regular rate and rhythm, no murmurs, rubs, or gallops, 2+  peripheral pulses and no peripheral edema.  LUNGS:  Diffuse expiratory wheezing, moderate air movement, good respiratory  effort.  ABDOMEN:  Positive bowel sounds, soft, nontender, no organomegaly.  EXTREMITIES:  Full range of motion.  NEUROLOGIC:  Grossly within normal limits.   LABORATORY DATA:  EKG showed normal sinus rhythm with nonspecific ST, T wave  changes in V1 to V3.  Chest x-ray showed no acute disease, COPD and emphysematous changes.   Sodium 137, potassium 3.6, chloride of 103, bicarb of 26, BUN of 4,  creatinine 0.7, glucose 106, calcium 8.6, AST is 16, ALT of less than 8,  alkaline phosphatase 92, total bilirubin 0.6, total protein 6.5, albumin  3.8, magnesium of 2.2, PT of 11.8, INR 0.8, PTT of 27.  Hemoglobin 15.6,  hematocrit 44.2, white blood count of 8100, platelet count of 255,000, 63%  neutrophils and MCV of 90.  Urinalysis was completely negative. Point of  care markers showed a myoglobin of 26.3 and 26.9. CK-MB was less than 1.0 x2  and troponin was less than 0.05 x2.   ASSESSMENT/PLAN:  A 70 year old white female with atypical chest pain and  nonspecific EKG changes.   1.  Chest pain atypical in nature. Will admit for serial cardiac enzymes and      EKG in the a.m. If within normal  limits can set the patient up for      outpatient stress-test, otherwise patient will have stress-test as an      inpatient.   1.  Prominent thyroid lobe on the right. Considering chest pain will get      TSH, free T4 and free T3. The patient may need an ultrasound.   1.  Chronic obstructive pulmonary disease. The patient is currently at her      baseline but seems optimally controlled with q.i.d. albuterol use at      home. Will place the patient on home regimen for now.   1.  Hypertension. The patient is on 5 or 10 mg of Plendil at home. Will      place on 5 mg daily for now as blood pressures are stable.      AD/MEDQ  D:  06/10/2004  T:  06/10/2004  Job:  [id number]   cc:   [facility]

## 2010-08-19 NOTE — Discharge Summary (Signed)
NAME:  MINAHIL, QUINLIVAN NO.:  [id number]   MEDICAL RECORD NO.:  [id number]          PATIENT TYPE:  INP   LOCATION:  4735                         FACILITY:  [facility]   PHYSICIAN:  Penni Bombard, MD       DATE OF BIRTH:  January 02, 1941   DATE OF ADMISSION:  06/10/2004  DATE OF DISCHARGE:  06/11/2004                                 DISCHARGE SUMMARY   PRIMARY CARE PHYSICIAN:  Paramedic at [employer].   CONSULTING PHYSICIAN:  None.   FINAL DIAGNOSES:  1.  Atypical chest pain.  2.  Hypertension.  3.  Chronic obstructive pulmonary disease.  4.  Depression.   PRINCIPAL PROCEDURES:  None.   ADMISSION HISTORY AND PHYSICAL:  Please see chart for full details.  However, in short, this is a 70 year old white female with a history of  COPD, who presents with atypical chest pain x1 day.  The patient began  having chest pain on the night prior to admission while at rest.  The pain  radiated into both her right and left chest with most of her pain being  substernal and described as a pressure.  She denied any shortness of breath  or diaphoresis associated with this pain, and the patient felt bad all day  on the day prior to admission.  The pain continued on the morning of  admission, and then the patient began to feel shaky and lightheaded, and  therefore came to the emergency room.  The patient has had chest pain once  before and states she was supposed to have a stress test performed, but  never did.   LABORATORY DATA:  Sodium 135, potassium 3.4, chloride 103, CO2 28, BUN 10,  creatinine 0.8, glucose 103.  T3 23.7, T4 9.7, TSH 2.391.  WBC 8.1,  hemoglobin 15.6, hematocrit 44.2, platelets 255.  AST 16, ALT less than 8,  alk-phos 92, T-bili 0.6, total protein 6.5, albumin 3.8, magnesium 2.2.  PT  11.8, INR 0.8, PTT 27.  Three sets of cardiac enzymes were all within normal  limits.   HOSPITAL COURSE:  1.  Chest pain:  The patient was admitted and monitored on  telemetry      overnight with no major events.  She was kept on oxygen.  However, she      had no oxygen requirement.  She required no sublingual nitrogen for      chest pain.  Her EKG on admission had flipped T-waves in lead V1 and      some other minor nonspecific changes.  Her EKG in the morning after      admission showed no significant changes.  Her cardiac enzymes x3 were      negative, and the patient's chest pain improved overnight.  She      contributes her pain improvement to not smoking and to the use of      Albuterol and Atrovent nebulizers in the hospital.  The patient will      need a Cardiolite performed as an outpatient.  Given that the patient  was discharged on a Saturday, we will call the patient on Monday with      the appointment time for her outpatient Cardiolite that we will schedule      for her.  The patient was also started on an aspirin this hospital      admission and was advised to continue to do so as an outpatient.  2.  Prominent thyroid:  The patient was found to have a prominent thyroid on      physical exam, and this was evaluated with laboratory values, T3, T4,      and TSH that were all found to be within normal limits.  No further      evaluation was performed.  3.  COPD:  The patient's COPD was felt to be at baseline per patient at      admission; however, she states that she uses her Albuterol four times a      day and was wheezing on admission.  She was placed on a scheduled      Albuterol and Atrovent nebulizers during her admission and refrained      from smoking and felt much better at time of discharge.  We felt she      would benefit from a more intensive regimen and therefore was continued      on her regular Advair, and her Albuterol was changed to Combivent.  4.  Tobacco abuse:  I discussed extensively her need for cessation, and the      patient said she has thought about it many times, but finds it very      difficult to quit.  She has  stated that she will try to Nicotrol inhaler      and attempt again to quit, and I gave her great encouragement to do so.      I encouraged her to seek further help from her primary care physician in      quitting if this would help her.  She does have a history of depression      and is on Effexor.  She may also benefit from Wellbutrin for her      depression as well as helping with smoking cessation.  5.  Depression:  This was stable throughout hospital admission and she was      continued on her Effexor.  6.  Hypertension:  This was stable throughout hospital admission and she was      continued on her Plendil.   INSTRUCTIONS TO PATIENT AND FAMILY:  The patient was instructed on new  medications and was instructed that we would call her on Monday with the  appointment of her followup stress test, and she was instructed to eat a  heart-healthy diet, and she was instructed to stop smoking.   DISCHARGE MEDICATIONS:  1.  Effexor 75 mg one tab p.o. daily.  2.  Flexeril 10 mg p.o. b.i.d.  3.  Aspirin 81 mg one tab p.o. daily.  4.  Advair 250/50 two puffs b.i.d.  5.  Plendil 5 mg p.o. daily.  6.  Combivent 120/21 one to two puffs p.o. q.i.d.  7.  Nicotrol inhaler frequent continuous puffing x21 with a max of 16 in one      day.      SJ/MEDQ  D:  06/11/2004  T:  06/12/2004  Job:  [id number]

## 2010-08-19 NOTE — Op Note (Signed)
   NAME:  Shelby Hunt, Shelby Hunt                           ACCOUNT NO.:  [id number]   MEDICAL RECORD NO.:  [id number]                   PATIENT TYPE:   LOCATION:                                       FACILITY:   PHYSICIAN:  J. Elvera Maria, M.D.                   DATE OF BIRTH:   DATE OF PROCEDURE:  11/11/2001  DATE OF DISCHARGE:                                 OPERATIVE REPORT   PREOPERATIVE DIAGNOSES:  Chronic smokers laryngitis with leukoplakia.   POSTOPERATIVE DIAGNOSES:  Chronic smokers laryngitis with leukoplakia,  pending histological evaluation.   OPERATION PERFORMED:  Micro-direct laryngoscopy and bilateral vocal cord  stripping.   SURGEON:  Kathy Breach, M.D.   ANESTHESIA:  General orotracheal anesthesia.   DESCRIPTION OF PROCEDURE:  With the patient under general orotracheal  anesthesia, the laryngoscope was introduced and both vallecula were clear,  lingual surface and epiglottis was normal.  Both piriform sinuses, post  cricoid area were normal mucosa.  The interior of the larynx was visualized.  The patient had bilateral leukoplakic patchy changes both vocal cords.  The  scope was suspended and with microscopic visualization, initially the left  vocal cord was completely stripped preserving a narrow 1 mm mucosa anterior  cord and anterior commissure area.  The entire superior surface was packed  into the ventricle, stripped  piecemeal with noticeable mucoid edematous  submucosal tissue stripping down as cleanly as possible under the vocalis  cord, vocalis muscle and tendon.  The right cord was stripped in similar  fashion, maintaining care to keep intact a small anterior commissure of  mucosa.  There was minimal bleeding.  Cottonoid with Adrenalin was placed  for hemostasis.  The patient tolerated the procedure well and was taken to  the recovery room in stable general condition.                                                 Kathy Breach, M.D.    Venia Minks  D:   11/11/2001  T:  11/13/2001  Job:  [id number]

## 2010-10-24 ENCOUNTER — Telehealth: Payer: Self-pay | Admitting: Family

## 2010-10-24 NOTE — Telephone Encounter (Signed)
Pls call [facility] and let them know that we are no longer PCP for this pt.  We are currently receiving consultation reports from their office.

## 2010-10-25 NOTE — Telephone Encounter (Signed)
Call placed to [facility] at [phone], spoke with Boyd Kerbs she was advised per Sandford Craze instructions. She states patient sees other [facility] providers, however she is not a patient with Endocrinology and did not know where the reports could be coming from.

## 2010-11-10 ENCOUNTER — Encounter (INDEPENDENT_AMBULATORY_CARE_PROVIDER_SITE_OTHER): Payer: Medicare Other

## 2010-11-10 DIAGNOSIS — C3490 Malignant neoplasm of unspecified part of unspecified bronchus or lung: Secondary | ICD-10-CM

## 2010-11-15 DIAGNOSIS — C3430 Malignant neoplasm of lower lobe, unspecified bronchus or lung: Secondary | ICD-10-CM

## 2011-01-02 LAB — GLUCOSE, CAPILLARY: Glucose-Capillary: 148 — ABNORMAL HIGH

## 2011-01-02 LAB — POCT HEMOGLOBIN-HEMACUE: Hemoglobin: 14.5

## 2011-01-04 LAB — BASIC METABOLIC PANEL
BUN: 12
CO2: 27
Calcium: 8.7
Chloride: 105
Creatinine, Ser: 0.62
GFR calc Af Amer: >60
GFR calc non Af Amer: >60
Glucose, Bld: 82
Potassium: 4.2
Sodium: 138

## 2011-01-13 LAB — I-STAT 8, (EC8 V) (CONVERTED LAB)
BUN: 7
Bicarbonate: 24.4 — ABNORMAL HIGH
Chloride: 107
Glucose, Bld: 112 — ABNORMAL HIGH
HCT: 45
Hemoglobin: 15.3 — ABNORMAL HIGH
Operator id: 123881
Potassium: 4.1
Sodium: 137
TCO2: 26
pCO2, Ven: 38.1 — ABNORMAL LOW
pH, Ven: 7.415 — ABNORMAL HIGH

## 2011-01-25 ENCOUNTER — Encounter: Payer: Self-pay | Admitting: Thoracic Surgery (Cardiothoracic Vascular Surgery)

## 2011-01-25 ENCOUNTER — Ambulatory Visit (INDEPENDENT_AMBULATORY_CARE_PROVIDER_SITE_OTHER): Payer: Medicare Other | Admitting: Physician Assistant

## 2011-01-25 DIAGNOSIS — C3410 Malignant neoplasm of upper lobe, unspecified bronchus or lung: Secondary | ICD-10-CM

## 2011-01-25 DIAGNOSIS — C3490 Malignant neoplasm of unspecified part of unspecified bronchus or lung: Secondary | ICD-10-CM

## 2011-01-25 NOTE — Progress Notes (Signed)
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  She has no new complaints.  Her hoarseness has not changed.  She swallows with no difficulty.  She is currently being treated by Dr. Allyne Gee with both radiation and chemotherapy. On exam the neck incision is well healed with no sn of complication.  She has no palpable scalene or  paraclavicular lymphadenopathy. Breath sounds are clear through out. The chest x-ray shows the chest wall port and permanent pacer lead appropriately positioned with no new complicating features. 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 No further follow up was planned with our office but she is encouraged to contact us at any time if we can be of assistance with her care.

## 2012-01-28 IMAGING — CR DG WRIST COMPLETE 3+V*L*
4 series · 4 of 4 positions shown · non-contrast
Comparison: None.

CLINICAL DATA: Left wrist pain - no known injury

LEFT WRIST - COMPLETE 3+ VIEW

[x wrist pa left]
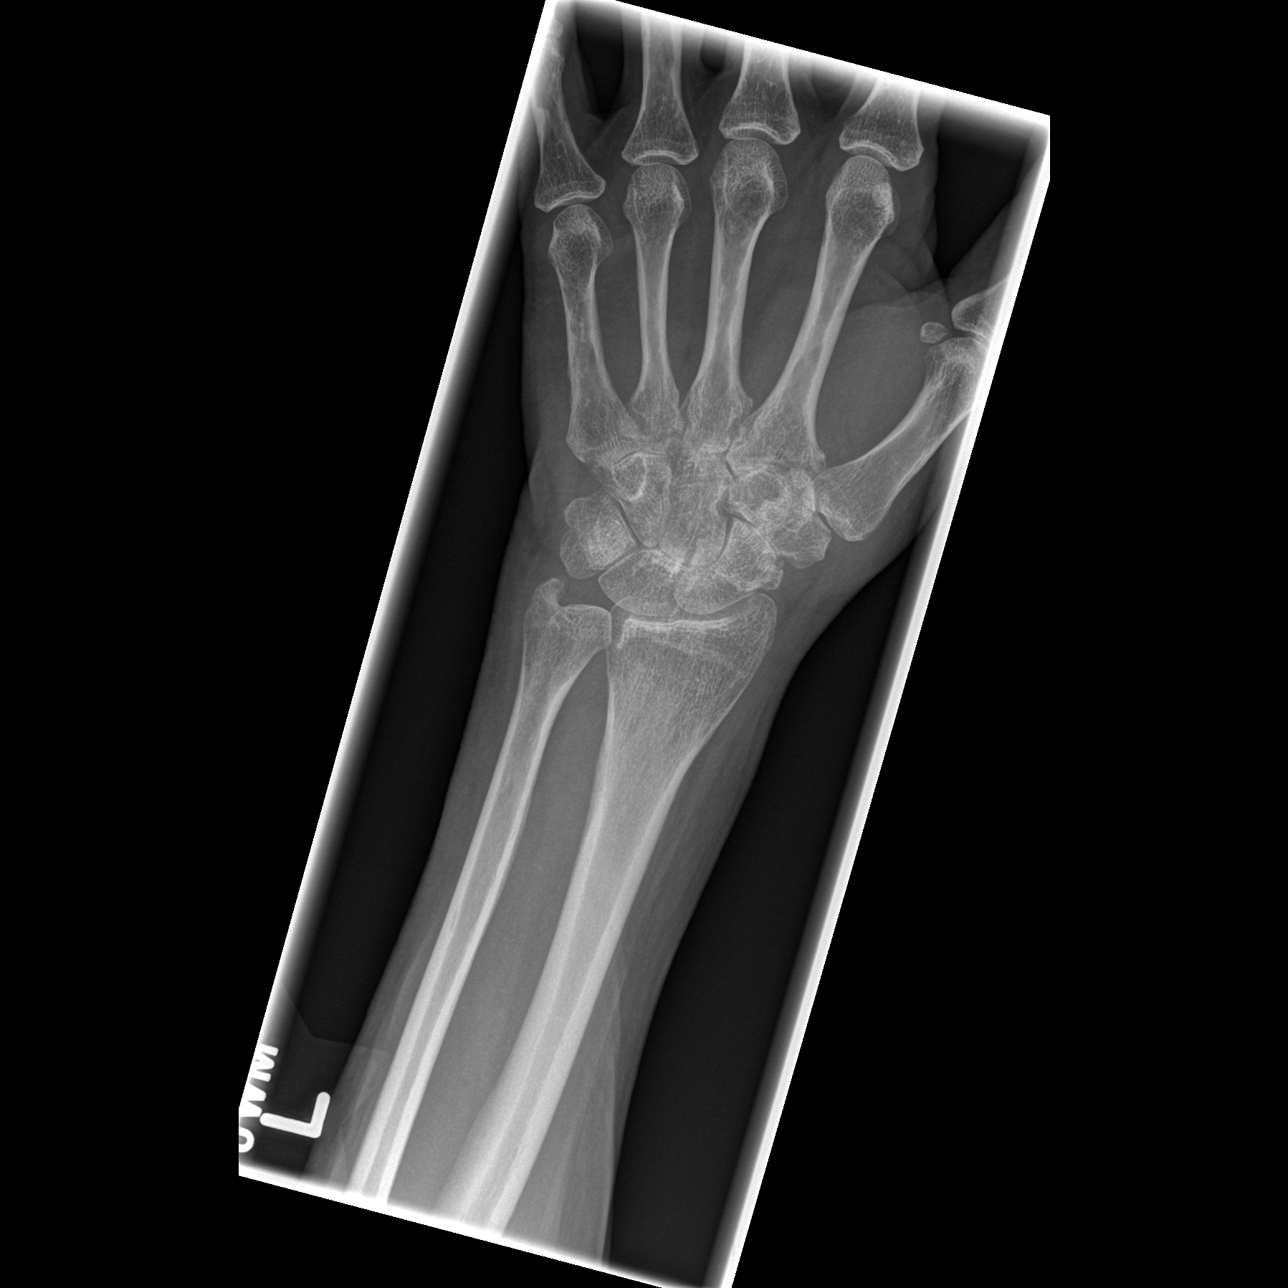

[x wrist obl left]
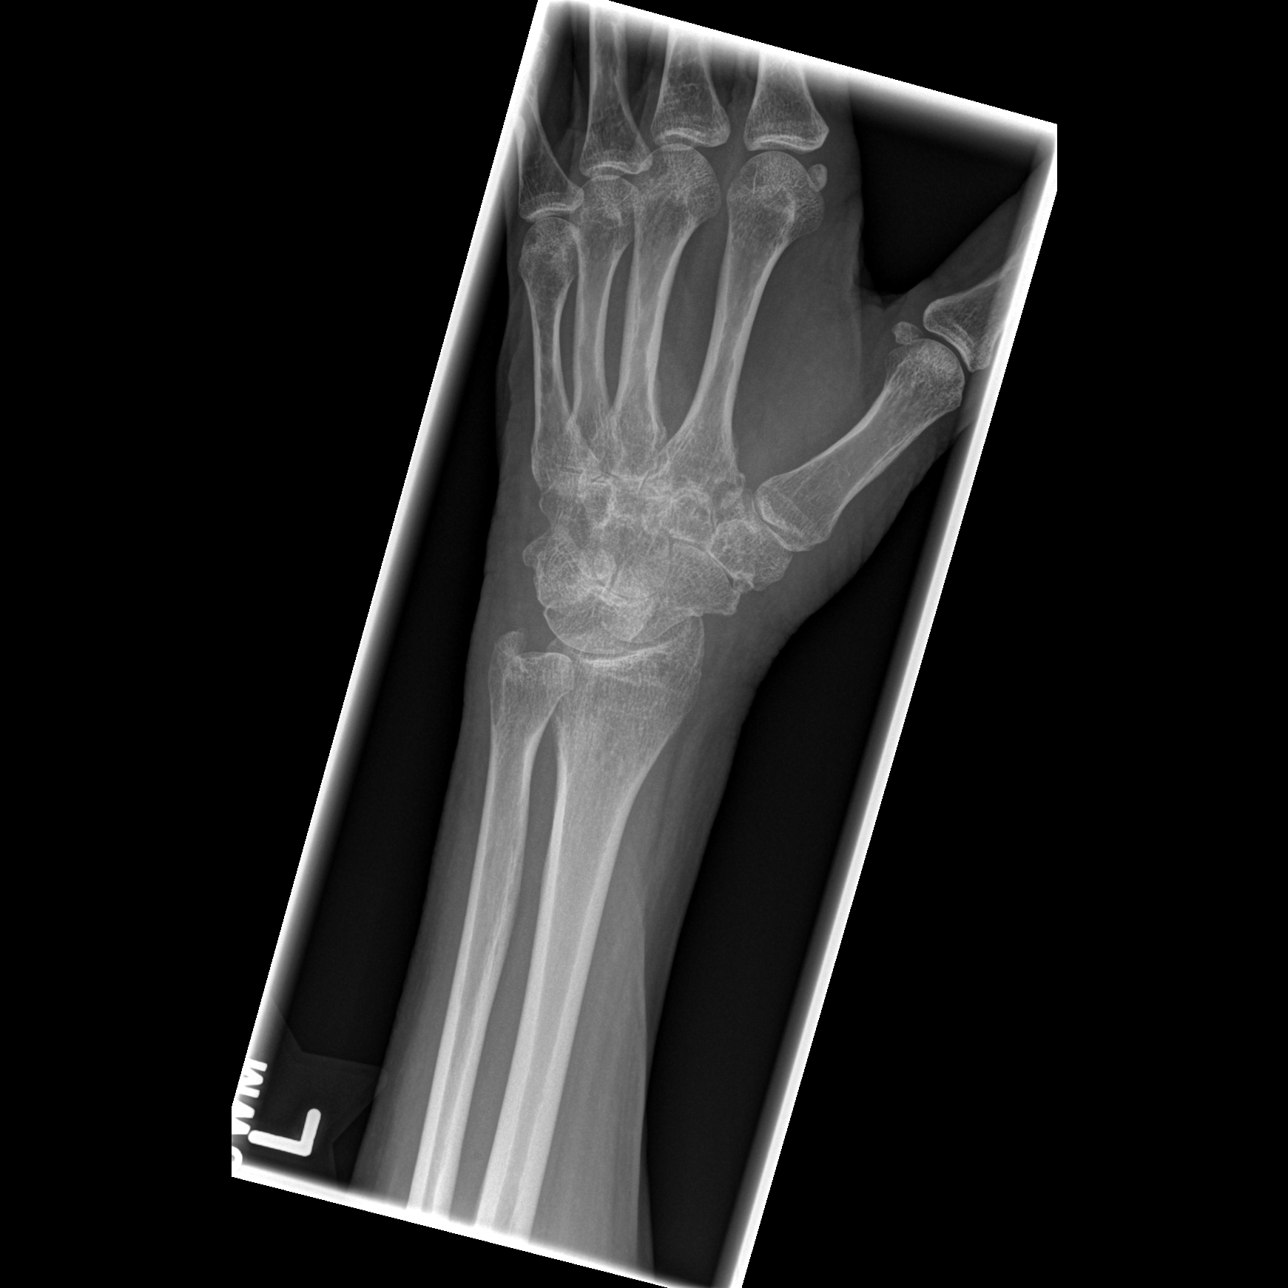

[x wrist lat left]
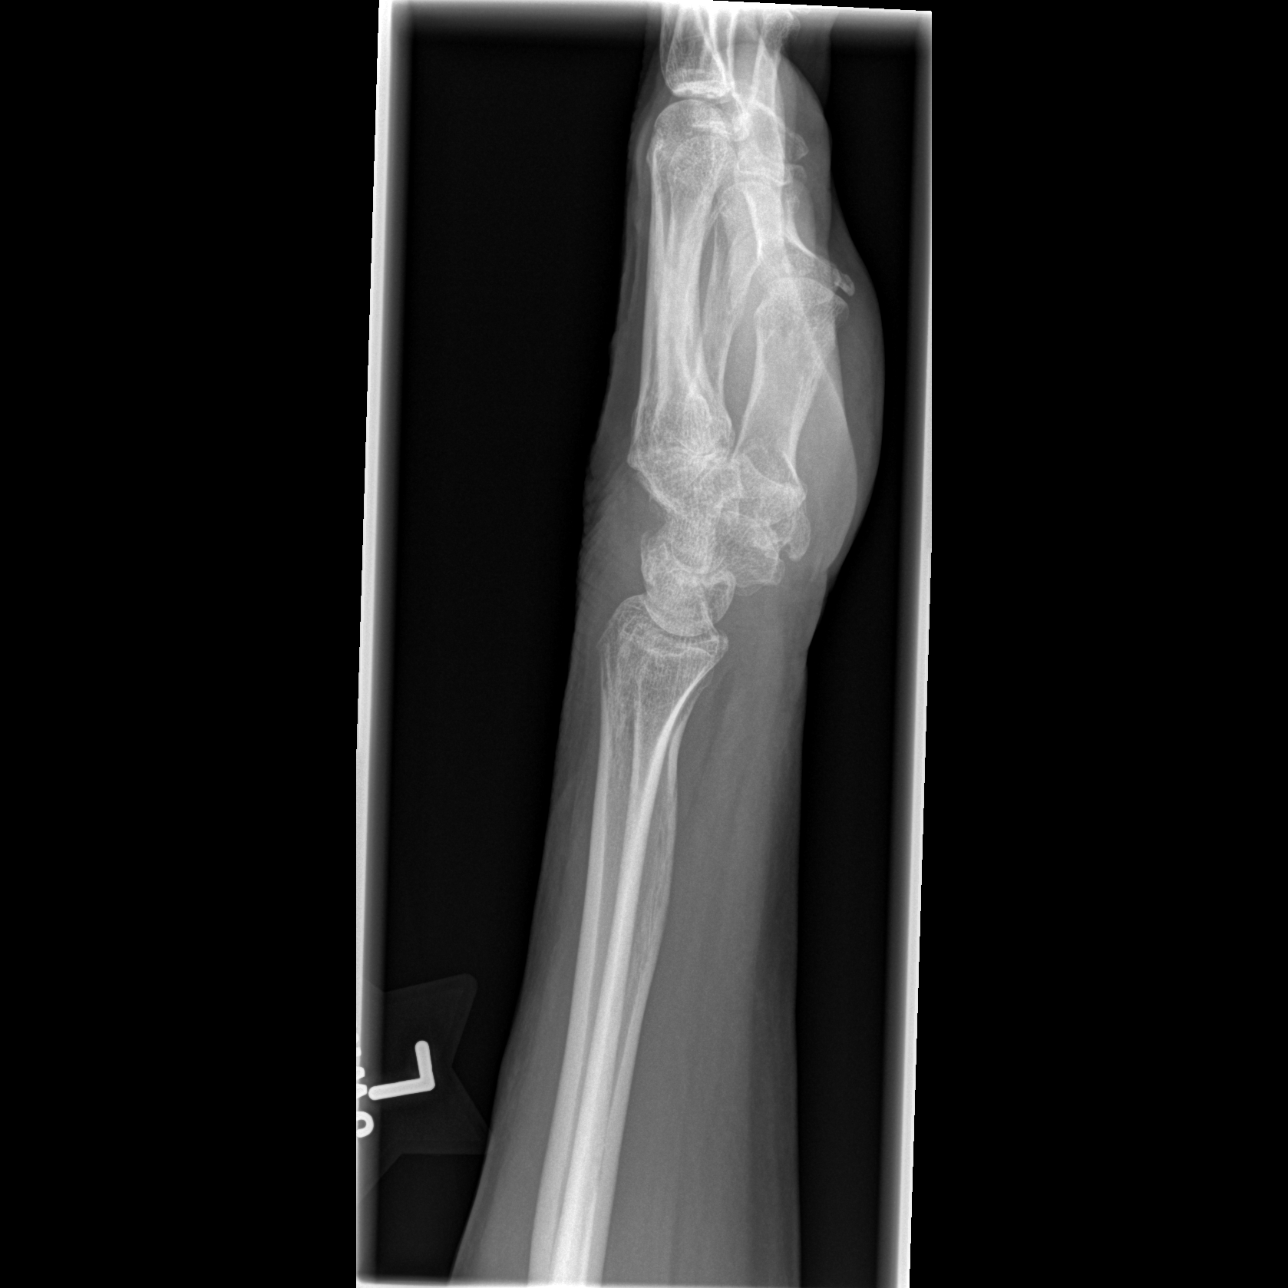

[x navicular]
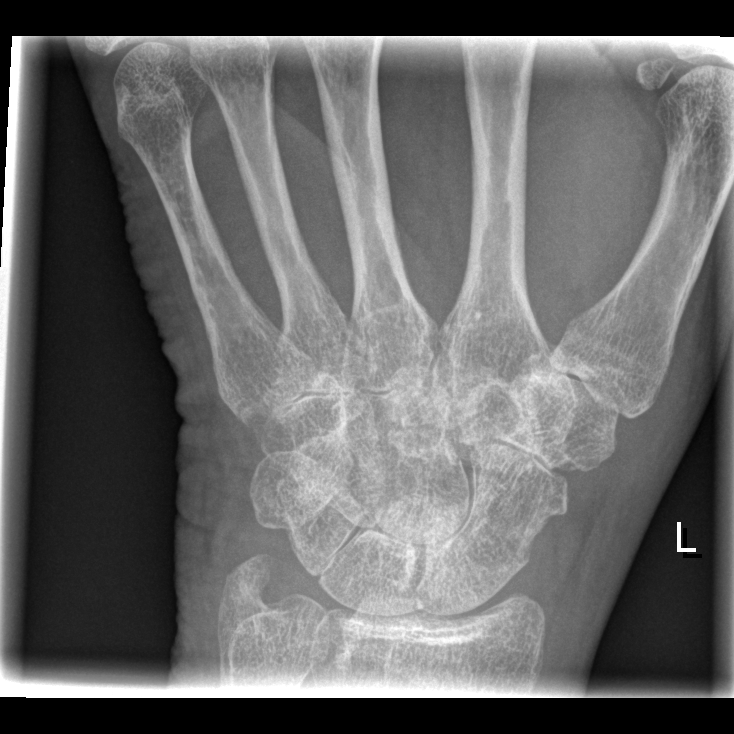

[4 of 4 positions shown; findings below may reference images not displayed]

FINDINGS: The bones are demineralized to some degree.  There are
mild degenerative changes noted.  No fracture, dislocation, or
changes of osteomyelitis.  Soft tissues unremarkable.
IMPRESSION: Osteopenia and mild degenerative changes - no acute findings.

## 2012-04-03 DEATH — deceased

## 2015-10-25 NOTE — Progress Notes (Signed)
Erroneous encounter
